# Patient Record
Sex: Female | Born: 1980 | Race: White | Hispanic: No | Marital: Married | Smoking: Never smoker
Health system: Southern US, Community
[De-identification: ages and names within clinical notes are randomized; demographics above are authoritative.]

## PROBLEM LIST (undated history)

## (undated) HISTORY — PX: EPIDURAL BLOOD PATCH: SHX1517

## (undated) HISTORY — PX: OTHER SURGICAL HISTORY: SHX169

---

## 2013-06-12 ENCOUNTER — Encounter (HOSPITAL_COMMUNITY): Payer: Self-pay | Admitting: Emergency Medicine

## 2013-06-12 ENCOUNTER — Other Ambulatory Visit: Payer: Self-pay | Admitting: Emergency Medicine

## 2013-06-12 ENCOUNTER — Emergency Department (HOSPITAL_COMMUNITY): Payer: BC Managed Care – PPO

## 2013-06-12 ENCOUNTER — Ambulatory Visit: Payer: BC Managed Care – PPO

## 2013-06-12 ENCOUNTER — Other Ambulatory Visit: Payer: Self-pay

## 2013-06-12 ENCOUNTER — Ambulatory Visit (INDEPENDENT_AMBULATORY_CARE_PROVIDER_SITE_OTHER): Payer: BC Managed Care – PPO | Admitting: Emergency Medicine

## 2013-06-12 ENCOUNTER — Emergency Department (HOSPITAL_COMMUNITY)
Admission: EM | Admit: 2013-06-12 | Discharge: 2013-06-12 | Disposition: A | Discharge status: Home / Self Care | Payer: BC Managed Care – PPO | Attending: Emergency Medicine | Admitting: Emergency Medicine

## 2013-06-12 VITALS — BP 139/93 | HR 116 | Temp 99.0°F | Resp 16 | Ht 61.5 in | Wt 141.0 lb

## 2013-06-12 DIAGNOSIS — R209 Unspecified disturbances of skin sensation: Secondary | ICD-10-CM | POA: Insufficient documentation

## 2013-06-12 DIAGNOSIS — R42 Dizziness and giddiness: Secondary | ICD-10-CM

## 2013-06-12 DIAGNOSIS — R51 Headache: Secondary | ICD-10-CM

## 2013-06-12 DIAGNOSIS — R202 Paresthesia of skin: Secondary | ICD-10-CM

## 2013-06-12 DIAGNOSIS — D72829 Elevated white blood cell count, unspecified: Secondary | ICD-10-CM

## 2013-06-12 DIAGNOSIS — R509 Fever, unspecified: Secondary | ICD-10-CM

## 2013-06-12 LAB — POCT URINALYSIS DIPSTICK
Bilirubin, UA: NEGATIVE
Blood, UA: NEGATIVE
Glucose, UA: NEGATIVE
Ketones, UA: NEGATIVE
Leukocytes, UA: NEGATIVE
Nitrite, UA: NEGATIVE
PROTEIN UA: NEGATIVE
SPEC GRAV UA: 1.02
UROBILINOGEN UA: 0.2
pH, UA: 7.5

## 2013-06-12 LAB — POCT UA - MICROSCOPIC ONLY
CASTS, UR, LPF, POC: NEGATIVE
CRYSTALS, UR, HPF, POC: NEGATIVE
Mucus, UA: NEGATIVE
RBC, urine, microscopic: NEGATIVE
WBC, Ur, HPF, POC: NEGATIVE
YEAST UA: NEGATIVE

## 2013-06-12 LAB — COMPREHENSIVE METABOLIC PANEL
ALBUMIN: 4.6 g/dL (ref 3.5–5.2)
ALBUMIN: 4.1 g/dL (ref 3.5–5.2)
ALK PHOS: 83 U/L (ref 39–117)
ALT: 12 U/L (ref 0–35)
ALT: 12 U/L (ref 0–35)
AST: 15 U/L (ref 0–37)
AST: 16 U/L (ref 0–37)
Alkaline Phosphatase: 79 U/L (ref 39–117)
BUN: 10 mg/dL (ref 6–23)
BUN: 9 mg/dL (ref 6–23)
CALCIUM: 9.8 mg/dL (ref 8.4–10.5)
CALCIUM: 10 mg/dL (ref 8.4–10.5)
CO2: 24 mEq/L (ref 19–32)
CO2: 25 mEq/L (ref 19–32)
Chloride: 100 mEq/L (ref 96–112)
Chloride: 101 mEq/L (ref 96–112)
Creat: 0.64 mg/dL (ref 0.50–1.10)
Creatinine, Ser: 0.57 mg/dL (ref 0.50–1.10)
GFR calc Af Amer: >90 mL/min (ref >90)
GFR calc non Af Amer: >90 mL/min (ref >90)
GLUCOSE: 133 mg/dL — AB (ref 70–99)
Glucose, Bld: 98 mg/dL (ref 70–99)
POTASSIUM: 4 meq/L (ref 3.5–5.3)
POTASSIUM: 4.2 meq/L (ref 3.7–5.3)
SODIUM: 136 meq/L (ref 135–145)
SODIUM: 141 meq/L (ref 137–147)
Total Bilirubin: 1.9 mg/dL — ABNORMAL HIGH (ref 0.2–1.2)
Total Bilirubin: 1.5 mg/dL — ABNORMAL HIGH (ref 0.3–1.2)
Total Protein: 8.1 g/dL (ref 6.0–8.3)
Total Protein: 8.2 g/dL (ref 6.0–8.3)

## 2013-06-12 LAB — POCT CBC
Granulocyte percent: 80.7 %G — AB (ref 37–80)
HEMATOCRIT: 44.8 % (ref 37.7–47.9)
HEMOGLOBIN: 14.6 g/dL (ref 12.2–16.2)
Lymph, poc: 1.9 (ref 0.6–3.4)
MCH, POC: 29.8 pg (ref 27–31.2)
MCHC: 32.6 g/dL (ref 31.8–35.4)
MCV: 91.5 fL (ref 80–97)
MID (cbc): 0.6 (ref 0–0.9)
MPV: 9.1 fL (ref 0–99.8)
POC GRANULOCYTE: 10.2 — AB (ref 2–6.9)
POC LYMPH PERCENT: 14.8 %L (ref 10–50)
POC MID %: 4.5 %M (ref 0–12)
Platelet Count, POC: 389 10*3/uL (ref 142–424)
RBC: 4.9 M/uL (ref 4.04–5.48)
RDW, POC: 13.6 %
WBC: 12.7 10*3/uL — AB (ref 4.6–10.2)

## 2013-06-12 LAB — GLUCOSE, POCT (MANUAL RESULT ENTRY): POC GLUCOSE: 110 mg/dL — AB (ref 70–99)

## 2013-06-12 LAB — POCT URINE PREGNANCY: Preg Test, Ur: NEGATIVE

## 2013-06-12 LAB — I-STAT TROPONIN, ED: Troponin i, poc: 0.02 ng/mL (ref 0.00–0.08)

## 2013-06-12 LAB — POCT RAPID STREP A (OFFICE): Rapid Strep A Screen: NEGATIVE

## 2013-06-12 LAB — TSH: TSH: 1.109 u[IU]/mL (ref 0.350–4.500)

## 2013-06-12 MED ORDER — SODIUM CHLORIDE 0.9 % IV BOLUS (SEPSIS)
1000.0000 mL | Freq: Once | INTRAVENOUS | Status: AC
  Administered 2013-06-12: 1000 mL via INTRAVENOUS

## 2013-06-12 NOTE — ED Notes (Signed)
Pt in c/o intermittent dizziness and "tingling" in her head since Friday, states symptoms have been getting progressively worse, started in the back of her head and have moved to bilateral sides of her head and now she feels it in the top of her head, states when these symptoms come on she can't focus on what she is doing and feels like she will pass out, symptoms are better when she is laying down, worse when she is sitting up, decided to come in today after having an episode while driving. Denies pain, but c/o nausea since last night and decreased appetite today. Denies any other symptoms, family has not noted any other neurological symptoms.

## 2013-06-12 NOTE — ED Notes (Signed)
Patient states ekg was done at urgent care and was negative.

## 2013-06-12 NOTE — Progress Notes (Addendum)
Subjective:    Patient ID: Chloe Vaughn, female    DOB: 25-Feb-1981, 33 y.o.   MRN: [id number] This chart was scribed for Collene Gobble, MD by Valera Castle, ED Scribe. This patient was seen in room 09 and the patient's care was started at 10:28 AM.  Chief Complaint  Patient presents with  . Dizziness    pt c/o tingling in back of head stressed with a new job feels anxioous waves of lightheadedness feels like she's going to pass out   HPI Chloe Vaughn is a 33 y.o. female Pt presents to the [facility] with intermittent tingling at the back of her head that wraps around to her forehead, onset 6 days ago. She states that she rested the evening of onset, felt better the next day, but then the tingling returned the beginning of this week. She reports her whole body will also start tingling and reports associated anxiety, palpitations, and lightheadedness with her tingling sensation, stating she feels like she is about to have a panic attack or syncopal episode. She reports h/o headaches, but states her recent head tingling is not consistent with the same. She denies her job being stressful. She states she may not be getting enough sleep. She reports her LNMP was last week. She denies being pregnant, has 2 children. She denies any medical history. She denies visual disturbance, decreased appetite, slurred speech, and any other associated symptoms.   Pt feels well when she is lying flat. 2.5 years ago when she had her epidural she suffered spinal fluid leak. She had to have a blood patch performed. The symptoms she is having now are very similar to that event.   PCP - No primary provider on file.  There are no active problems to display for this patient.  Prior to Admission medications   Not on File   Review of Systems  Constitutional: Negative for appetite change.  Eyes: Negative for visual disturbance.  Cardiovascular: Positive for palpitations.  Neurological: Positive for light-headedness. Negative for  syncope, speech difficulty, weakness and headaches.       Tingling at the back of her head. Generalized tingling over her body.   Psychiatric/Behavioral: Positive for decreased concentration. The patient is nervous/anxious.    BP 120/76  Pulse 96  Temp(Src) 99 F (37.2 C) (Oral)  Resp 16  Ht 5' 1.5" (1.562 m)  Wt 141 lb (63.957 kg)  BMI 26.21 kg/m2  SpO2 99%  LMP 05/31/2013     Objective:   Physical Exam Nursing note and vitals reviewed. Constitutional: Pt is oriented to person, place, and time. Pt appears well-developed and well-nourished. No distress.  HENT: Right TM nl. Left TM nl. Oropharynx clear and moist, no exudate. Nose nl.  Head: Normocephalic and atraumatic.  Eyes: EOM are normal. Pupils are equal, round, and reactive to light.  Neck: Neck supple. No thyromegaly. No cervical adenopathy.  Cardiovascular: Normal rate, regular rhythm and normal heart sounds.  Exam reveals no gallop and no friction rub. No murmur heard. Pulmonary/Chest: Effort normal and breath sounds normal. No respiratory distress. Pt has no wheezes. Pt has no rales.  Abdominal: Soft. Bowel sounds are normal. There is no tenderness. There is no rebound and no guarding. No hepatosplenomegaly. No CVA tenderness.  Musculoskeletal: Normal range of motion. No tenderness. No edema.   Neurological: Pt is alert and oriented to person, place, and time.  Skin: Skin is warm and dry.  Psychiatric: Pt has a normal mood and affect. Pt's behavior is  normal.      Results for orders placed in visit on 06/12/13  POCT CBC      Result Value Ref Range   WBC 12.7 (*) 4.6 - 10.2 K/uL   Lymph, poc 1.9  0.6 - 3.4   POC LYMPH PERCENT 14.8  10 - 50 %L   MID (cbc) 0.6  0 - 0.9   POC MID % 4.5  0 - 12 %M   POC Granulocyte 10.2 (*) 2 - 6.9   Granulocyte percent 80.7 (*) 37 - 80 %G   RBC 4.90  4.04 - 5.48 M/uL   Hemoglobin 14.6  12.2 - 16.2 g/dL   HCT, POC 16.144.8  09.637.7 - 47.9 %   MCV 91.5  80 - 97 fL   MCH, POC 29.8  27 - 31.2  pg   MCHC 32.6  31.8 - 35.4 g/dL   RDW, POC 04.513.6     Platelet Count, POC 389  142 - 424 K/uL   MPV 9.1  0 - 99.8 fL  GLUCOSE, POCT (MANUAL RESULT ENTRY)      Result Value Ref Range   POC Glucose 110 (*) 70 - 99 mg/dl  POCT URINE PREGNANCY      Result Value Ref Range   Preg Test, Ur Negative    POCT UA - MICROSCOPIC ONLY      Result Value Ref Range   WBC, Ur, HPF, POC neg     RBC, urine, microscopic neg     Bacteria, U Microscopic trace     Mucus, UA neg     Epithelial cells, urine per micros 0-5     Crystals, Ur, HPF, POC neg     Casts, Ur, LPF, POC neg     Yeast, UA neg    POCT URINALYSIS DIPSTICK      Result Value Ref Range   Color, UA yellow     Clarity, UA clear     Glucose, UA neg     Bilirubin, UA neg     Ketones, UA neg     Spec Grav, UA 1.020     Blood, UA neg     pH, UA 7.5     Protein, UA neg     Urobilinogen, UA 0.2     Nitrite, UA neg     Leukocytes, UA Negative    POCT RAPID STREP A (OFFICE)      Result Value Ref Range   Rapid Strep A Screen Negative  Negative   [facility] reading (PRIMARY) by  Dr Cleta Albertsaub no acute disease  EKG normal Assessment & Plan:   Patient presents with difficult symptoms are stinging burning sensation at the base of the neck. Her white count is elevated but no signs of infection are noted. She is tachycardic when upright. She feels much better when she lays down. I am concerned that possibly she could have an epidural leak. She is being sent to the emergency room for their evaluation and further imaging evaluation.  I personally performed the services described in this documentation, which was scribed in my presence. The recorded information has been reviewed and is accurate.      I personally performed the services described in this documentation, which was scribed in my presence. The recorded information has been reviewed and is accurate.

## 2013-06-12 NOTE — Patient Instructions (Signed)
I am concerned about your symptoms and the elevated white count. Some of the symptoms you're exhibiting could be representative of intracranial hypotension. You have had this problem once before. Please present herself to the hospital at [facility] for evaluation

## 2013-06-12 NOTE — ED Notes (Addendum)
Pt sent here from [facility] for further eval. Pt reports having multiple episodes of numbness/tingling to back of head and feeling "funny" and dizzy. Episodes have been getting progressively worse. ekg done at triage. No neuro deficits noted at triage. Skin w/d. At [facility], had temp of 99.0 and wbc elevated.

## 2013-06-12 NOTE — Discharge Instructions (Signed)
Paresthesia °Paresthesia is an abnormal burning or prickling sensation. This sensation is generally felt in the hands, arms, legs, or feet. However, it may occur in any part of the body. It is usually not painful. The feeling may be described as: °· Tingling or numbness. °· "Pins and needles." °· Skin crawling. °· Buzzing. °· Limbs "falling asleep." °· Itching. °Most people experience temporary (transient) paresthesia at some time in their lives. °CAUSES  °Paresthesia may occur when you breathe too quickly (hyperventilation). It can also occur without any apparent cause. Commonly, paresthesia occurs when pressure is placed on a nerve. The feeling quickly goes away once the pressure is removed. For some people, however, paresthesia is a long-lasting (chronic) condition caused by an underlying disorder. The underlying disorder may be: °· A traumatic, direct injury to nerves. Examples include a: °· Broken (fractured) neck. °· Fractured skull. °· A disorder affecting the brain and spinal cord (central nervous system). Examples include: °· Transverse myelitis. °· Encephalitis. °· Transient ischemic attack. °· Multiple sclerosis. °· Stroke. °· Tumor or blood vessel problems, such as an arteriovenous malformation pressing against the brain or spinal cord. °· A condition that damages the peripheral nerves (peripheral neuropathy). Peripheral nerves are not part of the brain and spinal cord. These conditions include: °· Diabetes. °· Peripheral vascular disease. °· Nerve entrapment syndromes, such as carpal tunnel syndrome. °· Shingles. °· Hypothyroidism. °· Vitamin B12 deficiencies. °· Alcoholism. °· Heavy metal poisoning (lead, arsenic). °· Rheumatoid arthritis. °· Systemic lupus erythematosus. °DIAGNOSIS  °Your caregiver will attempt to find the underlying cause of your paresthesia. Your caregiver may: °· Take your medical history. °· Perform a physical exam. °· Order various lab tests. °· Order imaging tests. °TREATMENT    °Treatment for paresthesia depends on the underlying cause. °HOME CARE INSTRUCTIONS °· Avoid drinking alcohol. °· You may consider massage or acupuncture to help relieve your symptoms. °· Keep all follow-up appointments as directed by your caregiver. °SEEK IMMEDIATE MEDICAL CARE IF:  °· You feel weak. °· You have trouble walking or moving. °· You have problems with speech or vision. °· You feel confused. °· You cannot control your bladder or bowel movements. °· You feel numbness after an injury. °· You faint. °· Your burning or prickling feeling gets worse when walking. °· You have pain, cramps, or dizziness. °· You develop a rash. °MAKE SURE YOU: °· Understand these instructions. °· Will watch your condition. °· Will get help right away if you are not doing well or get worse. °Document Released: 02/24/2002 Document Revised: 05/29/2011 Document Reviewed: 11/25/2010 °ExitCare® Patient Information ©2014 [employer]. ° °

## 2013-06-12 NOTE — Progress Notes (Signed)
   Subjective:    Patient ID: Chloe Vaughn, female    DOB: 02/16/1981, 33 y.o.   MRN: [id number]  HPI    Review of Systems     Objective:   Physical Exam        Assessment & Plan:

## 2013-06-12 NOTE — ED Provider Notes (Signed)
CSN: [id number]     Arrival date & time 06/12/13  1439 History   First MD Initiated Contact with Patient 06/12/13 1643     Chief Complaint  Patient presents with  . Dizziness     (Consider location/radiation/quality/duration/timing/severity/associated sxs/prior Treatment) Patient is a 33 y.o. female presenting with dizziness.  Dizziness  Pt reports about 5 days ago began to have mild headache which resolved with rest. She then developed a tingling sensation over the posterior scalp, comes and goes, not particular provoking or relieving factors. She also reports occasional episodes of feeling lightheadedness, not positional, no loss of consciousness. Dizziness and tingling are independent of each other per patient, do not happen at the same time. Denies vomiting, diarrhea, fever. Was seen at [facility] and had some labs done there with mild leukocytosis but otherwise normal. Pt states she has been under increased stress recently due to new job and thinks symptoms may be due to that. Doctor at [facility] mentioned concern for epidural leak, although she had epidural done during labor two and a half years ago.   History reviewed. No pertinent past medical history. Past Surgical History  Procedure Laterality Date  . Arthroscopic knee Right    Family History  Problem Relation Age of Onset  . Hypertension Mother   . Hypertension Maternal Grandmother   . Hyperlipidemia Maternal Grandmother   . Hypertension Maternal Grandfather   . Cancer Paternal Grandmother    History  Substance Use Topics  . Smoking status: Never Smoker   . Smokeless tobacco: Not on file  . Alcohol Use: No   OB History   Grav Para Term Preterm Abortions TAB SAB Ect Mult Living                 Review of Systems  Neurological: Positive for dizziness.   All other systems reviewed and are negative except as noted in HPI.     Allergies  Review of patient's allergies indicates no known allergies.  Home Medications    Current Outpatient Rx  Name  Route  Sig  Dispense  Refill  . ibuprofen (ADVIL,MOTRIN) 200 MG tablet   Oral   Take 400 mg by mouth every 6 (six) hours as needed for headache.          BP 125/67  Pulse 92  Temp(Src) 98.5 F (36.9 C) (Oral)  Resp 18  Ht 5\' 1"  (1.549 m)  Wt 141 lb (63.957 kg)  BMI 26.66 kg/m2  SpO2 100%  LMP 05/31/2013 Physical Exam  Nursing note and vitals reviewed. Constitutional: She is oriented to person, place, and time. She appears well-developed and well-nourished.  HENT:  Head: Normocephalic and atraumatic.  Eyes: EOM are normal. Pupils are equal, round, and reactive to light.  Neck: Normal range of motion. Neck supple.  Cardiovascular: Normal rate, normal heart sounds and intact distal pulses.   Pulmonary/Chest: Effort normal and breath sounds normal.  Abdominal: Bowel sounds are normal. She exhibits no distension. There is no tenderness.  Musculoskeletal: Normal range of motion. She exhibits no edema and no tenderness.  Neurological: She is alert and oriented to person, place, and time. She has normal strength. No cranial nerve deficit or sensory deficit. Coordination normal.  Skin: Skin is warm and dry. No rash noted.  Psychiatric: She has a normal mood and affect.    ED Course  Procedures (including critical care time) Labs Review Labs Reviewed  COMPREHENSIVE METABOLIC PANEL - Abnormal; Notable for the following:    Glucose, Bld  133 (*)    Total Bilirubin 1.5 (*)    All other components within normal limits  I-STAT TROPOININ, ED   Imaging Review Dg Chest 2 View  06/12/2013   CLINICAL DATA:  Dizziness, fever.  EXAM: CHEST  2 VIEW  COMPARISON:  None.  FINDINGS: The heart size and mediastinal contours are within normal limits. Both lungs are clear. The visualized skeletal structures are unremarkable.  IMPRESSION: No acute cardiopulmonary abnormality seen.   Electronically Signed   By: Roque LiasJames  Green M.D.   On: 06/12/2013 12:59   Ct Head Wo  Contrast  06/12/2013   CLINICAL DATA:  Dizziness and head tingling for multiple days, now worsening.  EXAM: CT HEAD WITHOUT CONTRAST  TECHNIQUE: Contiguous axial images were obtained from the base of the skull through the vertex without intravenous contrast.  COMPARISON:  None available for comparison at time of study interpretation.  FINDINGS: The ventricles and sulci are normal. No intraparenchymal hemorrhage, mass effect nor midline shift. No acute large vascular territory infarcts.  No abnormal extra-axial fluid collections. Basal cisterns are patent.  No skull fracture. Visualized paranasal sinuses and mastoid air-cells are well-aerated. The included ocular globes and orbital contents are non-suspicious.  IMPRESSION: No acute intracranial process.  Normal noncontrast CT of the head.   Electronically Signed   By: Awilda Metroourtnay  Bloomer   On: 06/12/2013 17:26     EKG Interpretation None      MDM   Final diagnoses:  Paresthesia    Pt with normal exam, [facility] reveiwed, added BMP and head CT also normal. Discussed with Dr. Cyril Mourningamillo on call for Neuro who recommends outpatient followup. PT amenable to this plan.     Levaeh Vice B. Bernette MayersSheldon, MD 06/12/13 [id number]

## 2013-06-13 LAB — BILIRUBIN, FRACTIONATED(TOT/DIR/INDIR)
BILIRUBIN TOTAL: 1.8 mg/dL — AB (ref 0.2–1.2)
Bilirubin, Direct: 0.3 mg/dL (ref 0.0–0.3)
Indirect Bilirubin: 1.5 mg/dL — ABNORMAL HIGH (ref 0.2–1.2)

## 2013-06-13 LAB — URINE CULTURE: Colony Count: 2000

## 2013-06-14 LAB — CULTURE, GROUP A STREP: Organism ID, Bacteria: NORMAL

## 2013-07-10 ENCOUNTER — Ambulatory Visit: Payer: BC Managed Care – PPO | Admitting: Neurology

## 2013-07-18 ENCOUNTER — Ambulatory Visit (INDEPENDENT_AMBULATORY_CARE_PROVIDER_SITE_OTHER): Payer: BC Managed Care – PPO | Admitting: Neurology

## 2013-07-18 ENCOUNTER — Encounter: Payer: Self-pay | Admitting: Neurology

## 2013-07-18 VITALS — BP 128/84 | HR 91 | Wt 140.3 lb

## 2013-07-18 DIAGNOSIS — R209 Unspecified disturbances of skin sensation: Secondary | ICD-10-CM

## 2013-07-18 DIAGNOSIS — R42 Dizziness and giddiness: Secondary | ICD-10-CM

## 2013-07-18 NOTE — Progress Notes (Signed)
[facility] Clinic Note - Initial Visit   Date: 07/18/2013    Chloe Vaughn MRN: [id number] DOB: 01/25/1981   Dear Dr Haskell Riling:  Thank you for your kind referral of Chloe Vaughn for consultation of lightheadedness. Although her history is well known to you, please allow [place] to reiterate it for the purpose of our medical record. The patient was accompanied to the clinic by self.   History of Present Illness: Chloe Vaughn is a 33 y.o. right-handed Caucasian female with no prior medical history presenting for evaluation of lightheaded and sensory disturbance.    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 It lasted about 20-minutes.  There was no associated palpitations, change in vision, numbness/tingling.  She continued to feel tired the remainder of the day.  About 5-days later, she developed tingling sensation over the base of her head.  She reports having about 5 epsidoes of tingling at the back of her head, lasting a few minutes and separate spells of "feeling as if she was going to pass out".  Symptoms were worse when she was sitting and improved if she got up and walked around.  Denies any headaches, ear pain, or changes in hearing.  Two days later, she the tingling started to radiate into the sides of her head and she also developed nausea.  Eventually symptoms worsened because it became constant and she felt as if she was having a panic attack because her heart was racing, so went to urgent care.  Her WBC was elevated so had CXR which was normal.  She was referred to the ED for CT head and EKG which was normal.  She tried ibuprofen on one occasion which did not change symptoms.  Since early April 2015, her symptoms have resolved.  She denies any new stress, vision changes, speech/swallow difficulty, chest pain, or weakness.  No history of similar spells.   Out-side paper records, electronic  medical record, and images have been reviewed where available and summarized as:  CT head wo contrast 06/12/2013:  No acute intracranial process. Lab Results  Component Value Date   TSH 1.109 06/12/2013     Past Medical History:  None  Past Surgical History  Procedure Laterality Date  . Arthroscopic knee Right   . Epidural blood patch       Medications:  Current Outpatient Prescriptions on File Prior to Visit  Medication Sig Dispense Refill  . ibuprofen (ADVIL,MOTRIN) 200 MG tablet Take 400 mg by mouth every 6 (six) hours as needed for headache.       No current facility-administered medications on file prior to visit.    Allergies: No Known Allergies  Family History: Family History  Problem Relation Age of Onset  . Hypertension Mother     Living, 70  . Hypertension Maternal Grandmother   . Hyperlipidemia Maternal Grandmother   . Hypertension Maternal Grandfather   . Cancer Paternal Grandmother   . Healthy Father     Living, 73  . Healthy Brother   . Healthy Daughter     Social History: History   Social History  . Marital Status: Married    Spouse Name: N/A    Number of Children: 1  . Years of Education: N/A   Occupational History  . RN    Social History Main Topics  . Smoking status: Never Smoker   . Smokeless tobacco: Not on file  . Alcohol  Use: Yes     Comment: Rare  . Drug Use: No  . Sexual Activity: Yes    Birth Control/ Protection: None   Other Topics Concern  . 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   EYES: No visual changes or eye pain ENT: No hearing changes.  No history of nose bleeds.   RESPIRATORY: No cough, wheezing and shortness of breath.     CARDIOVASCULAR: Negative for chest pain, and palpitations.   GI: Negative for abdominal discomfort, blood in stools or black stools.  No recent change in bowel habits.   GU:  No history of incontinence.   MUSCLOSKELETAL: No history of joint pain or swelling.  No myalgias.   SKIN: Negative for lesions, rash, and itching.   HEMATOLOGY/ONCOLOGY: Negative for prolonged bleeding, bruising easily, and swollen nodes.  No history of cancer.   ENDOCRINE: Negative for cold or heat intolerance, polydipsia or goiter.   PSYCH:  No depression or anxiety symptoms.   NEURO: As Above.   Vital Signs:  BP 128/84  Pulse 91  Wt 140 lb 5 oz (63.645 kg)  SpO2 98%  Neurological Exam: MENTAL STATUS including orientation to time, place, person, recent and remote memory, attention span and concentration, language, and fund of knowledge is normal.  Speech is not dysarthric.  CRANIAL NERVES: II:  No visual field defects.  Unremarkable fundi.   III-IV-VI: Pupils equal round and reactive to light.  Normal conjugate, extra-ocular eye movements in all directions of gaze.  No nystagmus.  No ptosis.   V:  Normal facial sensation.  Jaw jerk is absent.   VII:  Normal facial symmetry and movements.  Snout and palmomental reflexes bilaterally are absent. VIII:  Normal hearing and vestibular function.   IX-X:  Normal palatal movement.   XI:  Normal shoulder shrug and head rotation.   XII:  Normal tongue strength and range of motion, no deviation or fasciculation.  MOTOR:  No atrophy, fasciculations or abnormal movements.  No pronator drift.  Tone is normal.    Right Upper Extremity:    Left Upper Extremity:    Deltoid  5/5   Deltoid  5/5   Biceps  5/5   Biceps  5/5   Triceps  5/5   Triceps  5/5   Wrist extensors  5/5   Wrist extensors  5/5   Wrist flexors  5/5   Wrist flexors  5/5   Finger extensors  5/5   Finger extensors  5/5   Finger flexors  5/5   Finger flexors  5/5   Dorsal interossei  5/5   Dorsal  interossei  5/5   Abductor pollicis  5/5   Abductor pollicis  5/5   Tone (Ashworth scale)  0  Tone (Ashworth scale)  0   Right Lower Extremity:    Left Lower Extremity:    Hip flexors  5/5   Hip flexors  5/5   Hip extensors  5/5   Hip extensors  5/5   Knee flexors  5/5   Knee flexors  5/5   Knee extensors  5/5   Knee extensors  5/5   Dorsiflexors  5/5   Dorsiflexors  5/5  Plantarflexors  5/5   Plantarflexors  5/5   Toe extensors  5/5   Toe extensors  5/5   Toe flexors  5/5   Toe flexors  5/5   Tone (Ashworth scale)  0  Tone (Ashworth scale)  0   MSRs:  Right                                                                 Left brachioradialis 3+  brachioradialis 3+  biceps 3+  biceps 3+  triceps 3+  triceps 3+  patellar 3+  patellar 2+  ankle jerk 2+  ankle jerk 2+  Hoffman no  Hoffman no  plantar response down  plantar response down  No crossed adductors.  Plantar responses are flexor.  No clonus.  SENSORY:  Normal and symmetric perception of light touch, pinprick, vibration, and proprioception.  Romberg's sign absent.   COORDINATION/GAIT: Normal finger-to- nose-finger and heel-to-shin.  Intact rapid alternating movements bilaterally.  Able to rise from a chair without using arms.  Gait narrow based and stable. Tandem and stressed gait intact.    IMPRESSION/PLAN: Mrs. Lat is a 33 year old female presenting for episodic tingling at the base of her head and lightheadedness. On exam, her reflexes are symmetric and brisk throughout. In the absence of other associated upper motor neuron findings, reflexes may be normal for patient and age-appropriate.  Possible causes of her symptoms include occipital neuralgia, vasovagal syncope is less likely since lightheadedness was worse during sitting, demyelinating disease is lower on the differential but cannot be excluded. I discussed that additional imaging of the brain with MRI can be performed, however since she is clinically stable and her  neurological exam is nonfocal, she would like to hold off on further testing at this time. I informed her that if she develops new neurological symptoms, I have low threshold to image the brain. She will keep me informed on how she is doing.   The duration of this appointment visit was 40 minutes of face-to-face time with the patient.  Greater than 50% of this time was spent in counseling, explanation of diagnosis, planning of further management, and coordination of care.   Thank you for allowing me to participate in patient's care.  If I can answer any additional questions, I would be pleased to do so.    Sincerely,    Donika K. Allena KatzPatel, DO

## 2013-07-18 NOTE — Patient Instructions (Signed)
If you develop any new neurological symptoms, please contact my office.  We will plan on getting MRI brain going forward.

## 2013-07-18 NOTE — Progress Notes (Signed)
Note faxed.

## 2013-12-18 NOTE — Addendum Note (Signed)
Addended by: Lesle ChrisAUB, Victorino Fatzinger A on: 12/18/2013 09:37 AM   Modules accepted: Orders

## 2015-08-06 IMAGING — CT CT HEAD W/O CM
2 series · 16 of 30 positions shown, 18 images · non-contrast
Comparison: None available for comparison at time of study
interpretation.

CLINICAL DATA: Dizziness and head tingling for multiple days, now
worsening.

EXAM:
CT HEAD WITHOUT CONTRAST
TECHNIQUE: Contiguous axial images were obtained from the base of the skull
through the vertex without intravenous contrast.

[Series 2: head w/o · axial · non-contrast · 0.49mm/px · z∈[+87,+207]mm · 8 of 32 slices shown, 10 images]
[im 4/32  brain]
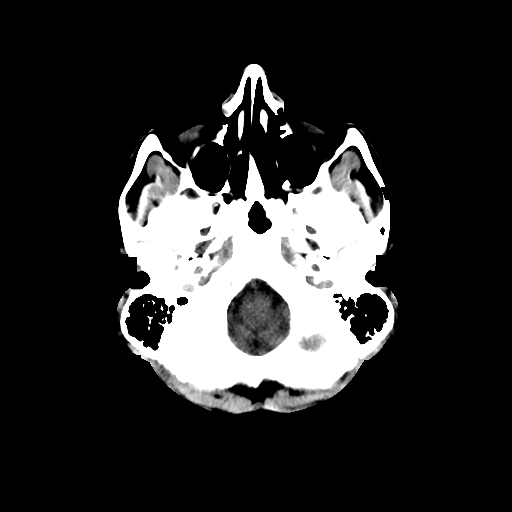
[im 4/32  bone]
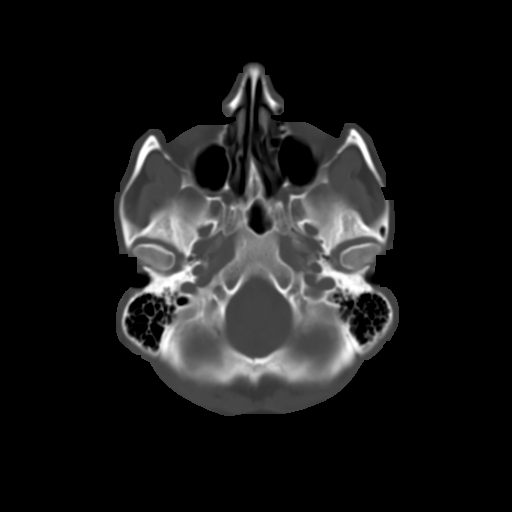
[im 7/32  brain]
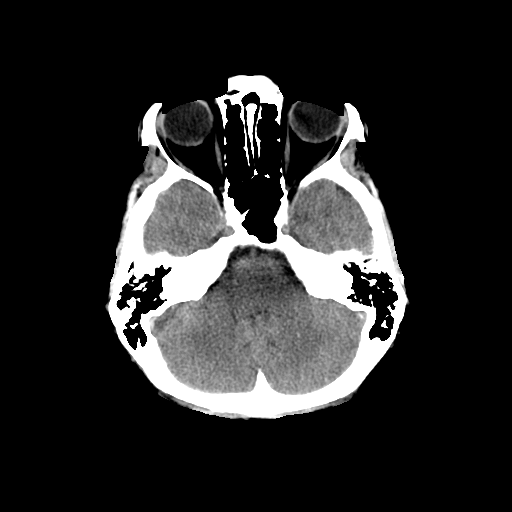
[im 11/32  brain]
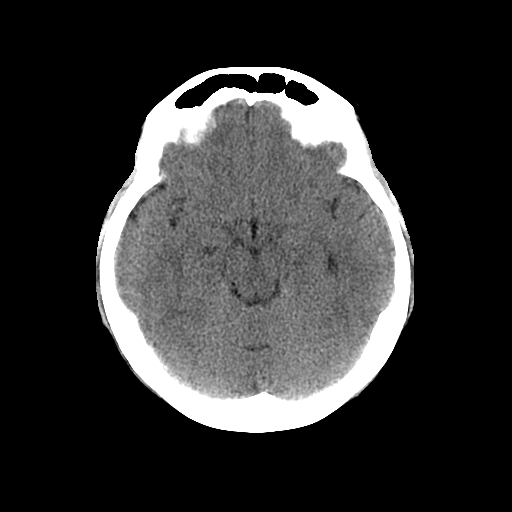
[im 14/32  brain]
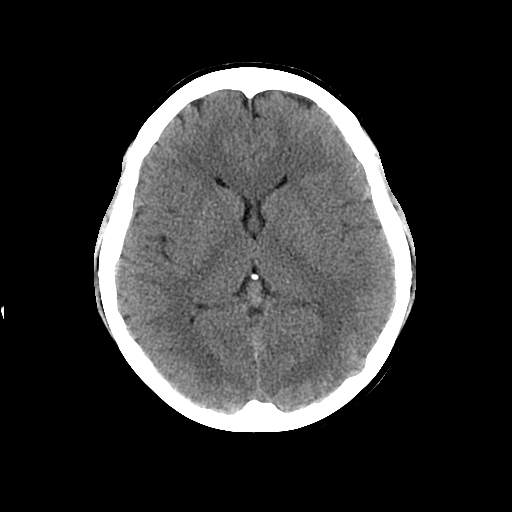
[im 18/32  brain]
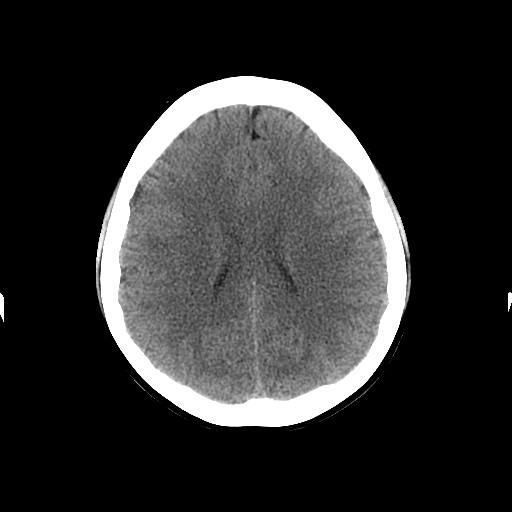
[im 18/32  bone]
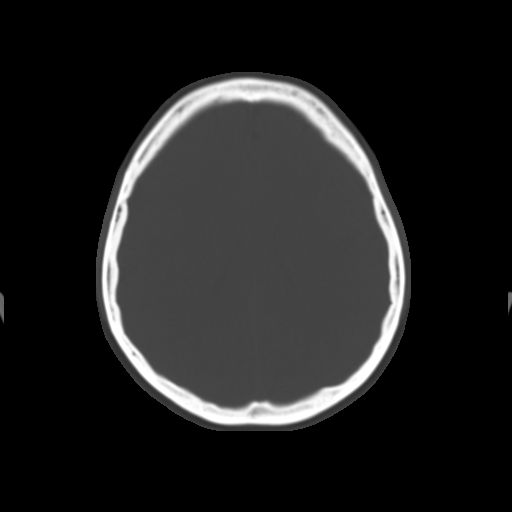
[im 21/32  brain]
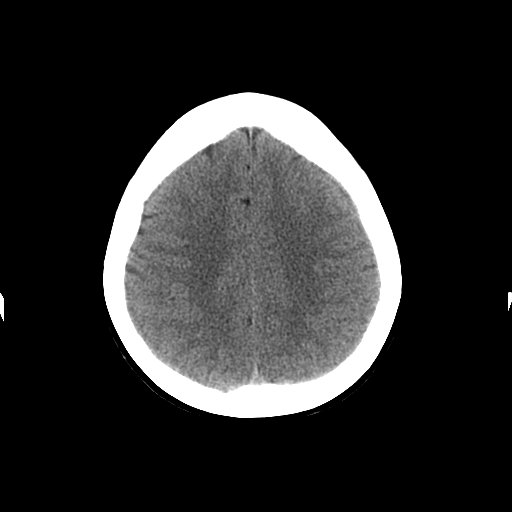
[im 25/32  brain]
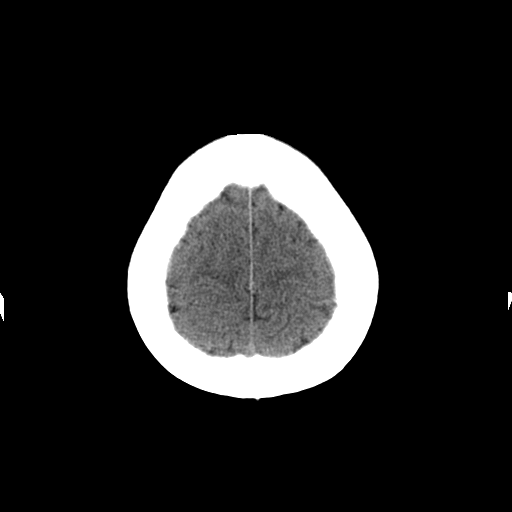
[im 28/32  brain]
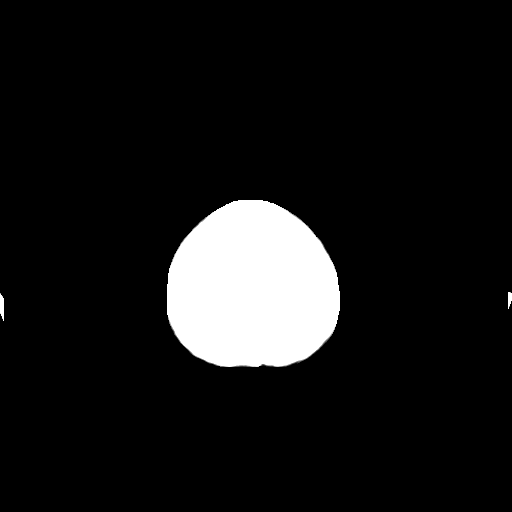

[Series 3: head w/o bone · axial · non-contrast · 0.49mm/px · z∈[+87,+202]mm · 8 of 63 slices shown]
[im 7/63  bone]
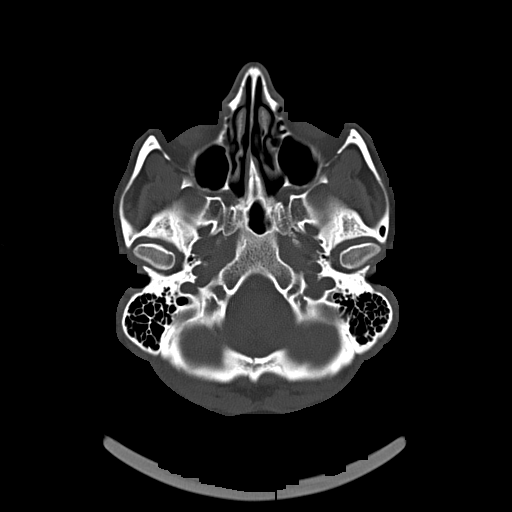
[im 14/63  bone]
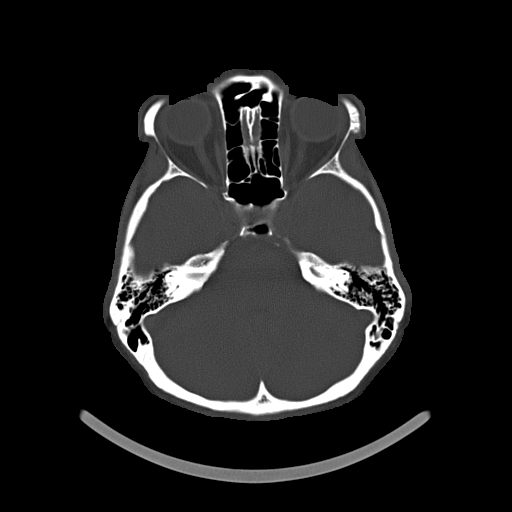
[im 20/63  bone]
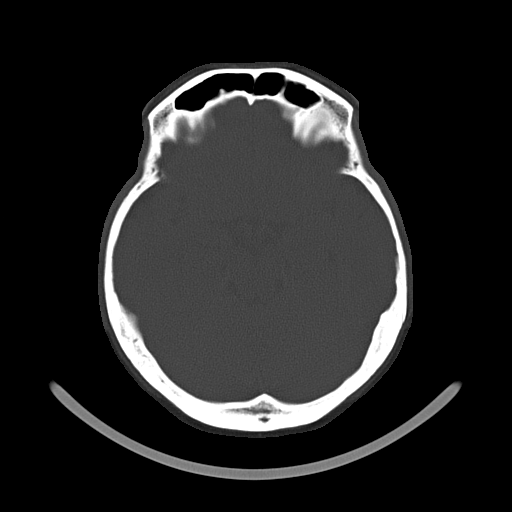
[im 27/63  bone]
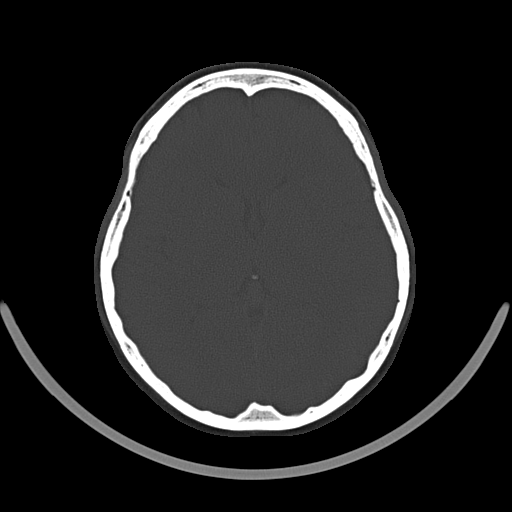
[im 33/63  bone]
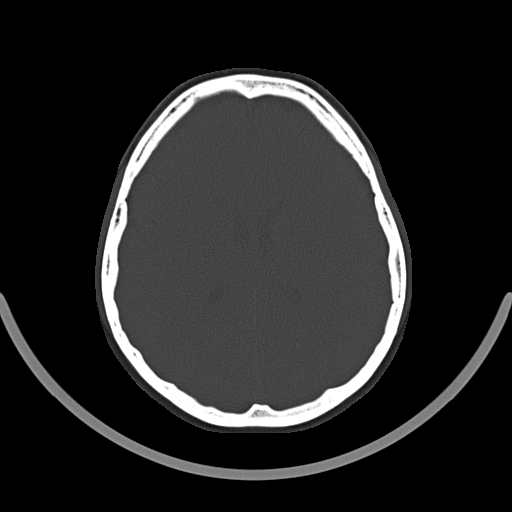
[im 40/63  bone]
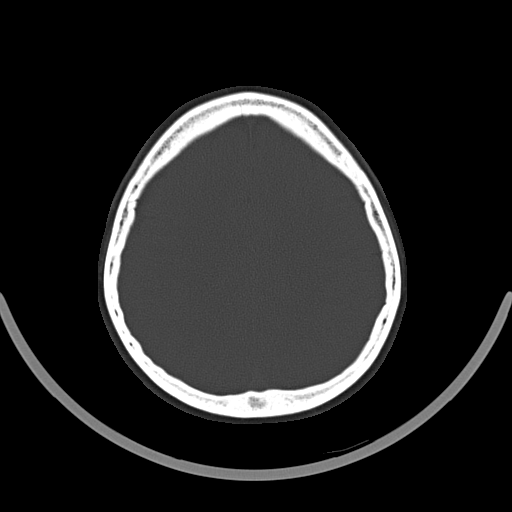
[im 46/63  bone]
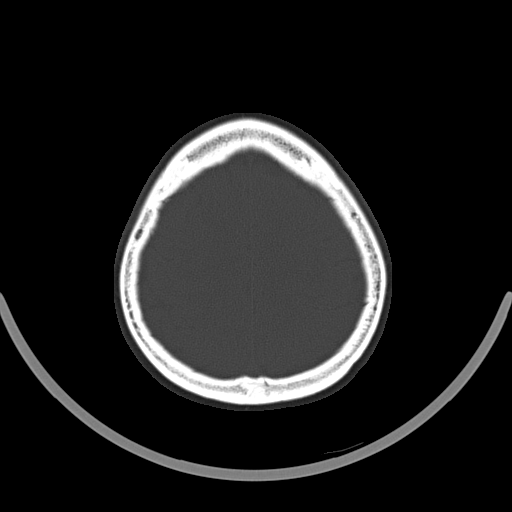
[im 53/63  bone]
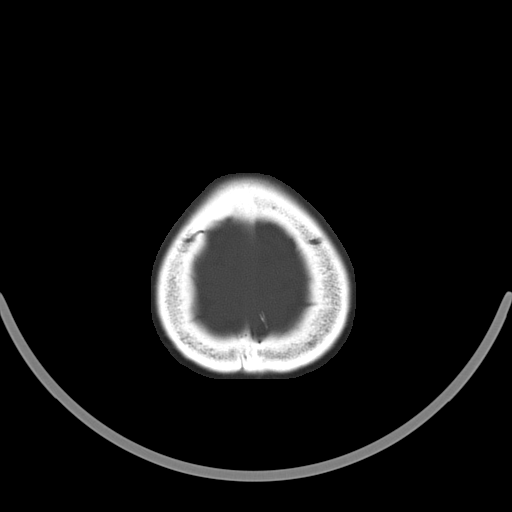

[16 of 30 positions shown; findings below may reference images not displayed]

FINDINGS: The ventricles and sulci are normal. No intraparenchymal hemorrhage,
mass effect nor midline shift. No acute large vascular territory
infarcts.

No abnormal extra-axial fluid collections. Basal cisterns are
patent.

No skull fracture. Visualized paranasal sinuses and mastoid
air-cells are well-aerated. The included ocular globes and orbital
contents are non-suspicious.
IMPRESSION: No acute intracranial process.  Normal noncontrast CT of the head.

  By: G-Boy Avela
